# Patient Record
Sex: Male | Born: 1935 | Race: White | Hispanic: No | Marital: Married
Health system: Southern US, Community
[De-identification: ages and names within clinical notes are randomized; demographics above are authoritative.]

## PROBLEM LIST (undated history)

## (undated) DIAGNOSIS — I10 Essential (primary) hypertension: Secondary | ICD-10-CM

## (undated) DIAGNOSIS — E119 Type 2 diabetes mellitus without complications: Secondary | ICD-10-CM

## (undated) DIAGNOSIS — N289 Disorder of kidney and ureter, unspecified: Secondary | ICD-10-CM

---

## 2022-04-07 ENCOUNTER — Emergency Department (HOSPITAL_BASED_OUTPATIENT_CLINIC_OR_DEPARTMENT_OTHER): Payer: Medicare Other

## 2022-04-07 ENCOUNTER — Other Ambulatory Visit: Payer: Self-pay

## 2022-04-07 ENCOUNTER — Encounter (HOSPITAL_BASED_OUTPATIENT_CLINIC_OR_DEPARTMENT_OTHER): Payer: Self-pay | Admitting: Emergency Medicine

## 2022-04-07 DIAGNOSIS — Z20822 Contact with and (suspected) exposure to covid-19: Secondary | ICD-10-CM | POA: Insufficient documentation

## 2022-04-07 DIAGNOSIS — R509 Fever, unspecified: Secondary | ICD-10-CM | POA: Insufficient documentation

## 2022-04-07 DIAGNOSIS — R059 Cough, unspecified: Secondary | ICD-10-CM | POA: Insufficient documentation

## 2022-04-07 DIAGNOSIS — R0981 Nasal congestion: Secondary | ICD-10-CM | POA: Insufficient documentation

## 2022-04-07 LAB — CBC WITH DIFFERENTIAL/PLATELET
Abs Immature Granulocytes: 0.03 10*3/uL (ref 0.00–0.07)
Basophils Absolute: 0.1 10*3/uL (ref 0.0–0.1)
Basophils Relative: 1 %
Eosinophils Absolute: 0.5 10*3/uL (ref 0.0–0.5)
Eosinophils Relative: 5 %
HCT: 37.2 % — ABNORMAL LOW (ref 39.0–52.0)
Hemoglobin: 12.4 g/dL — ABNORMAL LOW (ref 13.0–17.0)
Immature Granulocytes: 0 %
Lymphocytes Relative: 19 %
Lymphs Abs: 1.9 10*3/uL (ref 0.7–4.0)
MCH: 30.3 pg (ref 26.0–34.0)
MCHC: 33.3 g/dL (ref 30.0–36.0)
MCV: 91 fL (ref 80.0–100.0)
Monocytes Absolute: 1.1 10*3/uL — ABNORMAL HIGH (ref 0.1–1.0)
Monocytes Relative: 11 %
Neutro Abs: 6.2 10*3/uL (ref 1.7–7.7)
Neutrophils Relative %: 64 %
Platelets: 184 10*3/uL (ref 150–400)
RBC: 4.09 MIL/uL — ABNORMAL LOW (ref 4.22–5.81)
RDW: 13.7 % (ref 11.5–15.5)
WBC: 9.8 10*3/uL (ref 4.0–10.5)
nRBC: 0 % (ref 0.0–0.2)

## 2022-04-07 LAB — COMPREHENSIVE METABOLIC PANEL
ALT: 25 U/L (ref 0–44)
AST: 20 U/L (ref 15–41)
Albumin: 3.8 g/dL (ref 3.5–5.0)
Alkaline Phosphatase: 83 U/L (ref 38–126)
Anion gap: 8 (ref 5–15)
BUN: 33 mg/dL — ABNORMAL HIGH (ref 8–23)
CO2: 22 mmol/L (ref 22–32)
Calcium: 8.9 mg/dL (ref 8.9–10.3)
Chloride: 108 mmol/L (ref 98–111)
Creatinine, Ser: 1.8 mg/dL — ABNORMAL HIGH (ref 0.61–1.24)
GFR, Estimated: 36 mL/min — ABNORMAL LOW (ref >60)
Glucose, Bld: 203 mg/dL — ABNORMAL HIGH (ref 70–99)
Potassium: 4.1 mmol/L (ref 3.5–5.1)
Sodium: 138 mmol/L (ref 135–145)
Total Bilirubin: 0.8 mg/dL (ref 0.3–1.2)
Total Protein: 7.7 g/dL (ref 6.5–8.1)

## 2022-04-07 LAB — LACTIC ACID, PLASMA: Lactic Acid, Venous: 1 mmol/L (ref 0.5–1.9)

## 2022-04-07 NOTE — ED Notes (Signed)
Unable to provide urine sample at this time

## 2022-04-07 NOTE — ED Triage Notes (Signed)
Fever and cough with shaking since yesterday. States he has some numbness in his L fingertips since yesterday. He just travelled by car here from [place].

## 2022-04-08 ENCOUNTER — Emergency Department (HOSPITAL_BASED_OUTPATIENT_CLINIC_OR_DEPARTMENT_OTHER)
Admission: EM | Admit: 2022-04-08 | Discharge: 2022-04-08 | Disposition: A | Discharge status: Home / Self Care | Payer: Medicare Other | Attending: Emergency Medicine | Admitting: Emergency Medicine

## 2022-04-08 ENCOUNTER — Emergency Department (HOSPITAL_BASED_OUTPATIENT_CLINIC_OR_DEPARTMENT_OTHER): Payer: Medicare Other

## 2022-04-08 ENCOUNTER — Encounter (HOSPITAL_BASED_OUTPATIENT_CLINIC_OR_DEPARTMENT_OTHER): Payer: Self-pay

## 2022-04-08 DIAGNOSIS — B9789 Other viral agents as the cause of diseases classified elsewhere: Secondary | ICD-10-CM

## 2022-04-08 DIAGNOSIS — R509 Fever, unspecified: Secondary | ICD-10-CM | POA: Diagnosis not present

## 2022-04-08 DIAGNOSIS — N289 Disorder of kidney and ureter, unspecified: Secondary | ICD-10-CM

## 2022-04-08 HISTORY — DX: Essential (primary) hypertension: I10

## 2022-04-08 HISTORY — DX: Type 2 diabetes mellitus without complications: E11.9

## 2022-04-08 HISTORY — DX: Disorder of kidney and ureter, unspecified: N28.9

## 2022-04-08 LAB — RESP PANEL BY RT-PCR (FLU A&B, COVID) ARPGX2
Influenza A by PCR: NEGATIVE
Influenza B by PCR: NEGATIVE
SARS Coronavirus 2 by RT PCR: NEGATIVE

## 2022-04-08 MED ORDER — IOHEXOL 350 MG/ML SOLN
75.0000 mL | Freq: Once | INTRAVENOUS | Status: AC | PRN
  Administered 2022-04-08: 75 mL via INTRAVENOUS

## 2022-04-08 NOTE — ED Provider Notes (Addendum)
[facility] EMERGENCY DEPARTMENT Provider Note   CSN: [id number] Arrival date & time: 04/07/22  2148     History  Chief Complaint  Patient presents with   Fever    Phillip Potts is a 88 y.o. male.  The history is provided by the patient.  URI Presenting symptoms: congestion and cough   Severity:  Moderate Onset quality:  Gradual Duration:  4 days Timing:  Intermittent Progression:  Unchanged Chronicity:  New Relieved by:  Nothing Worsened by:  Nothing Ineffective treatments:  None tried Associated symptoms: no arthralgias and no headaches   Risk factors: being elderly   Was visiting daughter who had a cold and now he has it.  Denies fever. Has not taken anything for symptoms.      Home Medications Prior to Admission medications   Not on File      Allergies    Patient has no known allergies.    Review of Systems   Review of Systems  HENT:  Positive for congestion. Negative for trouble swallowing and voice change.   Respiratory:  Positive for cough. Negative for shortness of breath and stridor.   Gastrointestinal:  Negative for vomiting.  Musculoskeletal:  Negative for arthralgias.  Neurological:  Negative for headaches.  All other systems reviewed and are negative.  Physical Exam Updated Vital Signs BP (!) 159/105 (BP Location: Left Arm)   Pulse 76   Temp 98.3 F (36.8 C) (Oral)   Resp 20   Ht 5\' 7"  (1.702 m)   Wt 69.9 kg   SpO2 99%   BMI 24.12 kg/m  Physical Exam Vitals and nursing note reviewed.  Constitutional:      General: He is not in acute distress.    Appearance: Normal appearance.  HENT:     Head: Normocephalic and atraumatic.     Nose: Congestion present.     Mouth/Throat:     Mouth: Mucous membranes are moist.  Eyes:     Pupils: Pupils are equal, round, and reactive to light.  Cardiovascular:     Rate and Rhythm: Normal rate and regular rhythm.     Pulses: Normal pulses.     Heart sounds: Normal heart sounds.  Pulmonary:      Effort: Pulmonary effort is normal.     Breath sounds: Normal breath sounds.  Abdominal:     General: Bowel sounds are normal.     Palpations: Abdomen is soft.     Tenderness: There is no abdominal tenderness. There is no guarding.  Musculoskeletal:        General: Normal range of motion.     Cervical back: Normal range of motion and neck supple.  Skin:    General: Skin is warm and dry.     Capillary Refill: Capillary refill takes less than 2 seconds.  Neurological:     General: No focal deficit present.     Mental Status: He is alert and oriented to person, place, and time.     Sensory: No sensory deficit.     Motor: No weakness.     Deep Tendon Reflexes: Reflexes normal.  Psychiatric:        Mood and Affect: Mood normal.        Behavior: Behavior normal.    ED Results / Procedures / Treatments   Labs (all labs ordered are listed, but only abnormal results are displayed) Results for orders placed or performed during the hospital encounter of 04/08/22  Lactic acid, plasma  Result Value  Ref Range   Lactic Acid, Venous 1.0 0.5 - 1.9 mmol/L  Comprehensive metabolic panel  Result Value Ref Range   Sodium 138 135 - 145 mmol/L   Potassium 4.1 3.5 - 5.1 mmol/L   Chloride 108 98 - 111 mmol/L   CO2 22 22 - 32 mmol/L   Glucose, Bld 203 (H) 70 - 99 mg/dL   BUN 33 (H) 8 - 23 mg/dL   Creatinine, Ser 1.611.80 (H) 0.61 - 1.24 mg/dL   Calcium 8.9 8.9 - 09.610.3 mg/dL   Total Protein 7.7 6.5 - 8.1 g/dL   Albumin 3.8 3.5 - 5.0 g/dL   AST 20 15 - 41 U/L   ALT 25 0 - 44 U/L   Alkaline Phosphatase 83 38 - 126 U/L   Total Bilirubin 0.8 0.3 - 1.2 mg/dL   GFR, Estimated 36 (L) >60 mL/min   Anion gap 8 5 - 15  CBC with Differential  Result Value Ref Range   WBC 9.8 4.0 - 10.5 K/uL   RBC 4.09 (L) 4.22 - 5.81 MIL/uL   Hemoglobin 12.4 (L) 13.0 - 17.0 g/dL   HCT 04.537.2 (L) 40.939.0 - 81.152.0 %   MCV 91.0 80.0 - 100.0 fL   MCH 30.3 26.0 - 34.0 pg   MCHC 33.3 30.0 - 36.0 g/dL   RDW 91.413.7 78.211.5 - 95.615.5 %    Platelets 184 150 - 400 K/uL   nRBC 0.0 0.0 - 0.2 %   Neutrophils Relative % 64 %   Neutro Abs 6.2 1.7 - 7.7 K/uL   Lymphocytes Relative 19 %   Lymphs Abs 1.9 0.7 - 4.0 K/uL   Monocytes Relative 11 %   Monocytes Absolute 1.1 (H) 0.1 - 1.0 K/uL   Eosinophils Relative 5 %   Eosinophils Absolute 0.5 0.0 - 0.5 K/uL   Basophils Relative 1 %   Basophils Absolute 0.1 0.0 - 0.1 K/uL   Immature Granulocytes 0 %   Abs Immature Granulocytes 0.03 0.00 - 0.07 K/uL   DG Chest 2 View  Result Date: 04/07/2022 CLINICAL DATA:  Fever and cough. EXAM: CHEST - 2 VIEW COMPARISON:  None Available. FINDINGS: Minimal bibasilar densities, likely atelectasis. Developing infiltrate is less likely. No lobar consolidation. There is no pleural effusion or pneumothorax. The cardiac silhouette is within limits. Atherosclerotic calcification of the aorta. No acute osseous pathology. Degenerative changes of the spine. IMPRESSION: Minimal bibasilar densities, likely atelectasis. Developing infiltrate is less likely. Electronically Signed   By: Elgie CollardArash  Radparvar M.D.   On: 04/07/2022 22:22      EKG None  Radiology DG Chest 2 View  Result Date: 04/07/2022 CLINICAL DATA:  Fever and cough. EXAM: CHEST - 2 VIEW COMPARISON:  None Available. FINDINGS: Minimal bibasilar densities, likely atelectasis. Developing infiltrate is less likely. No lobar consolidation. There is no pleural effusion or pneumothorax. The cardiac silhouette is within limits. Atherosclerotic calcification of the aorta. No acute osseous pathology. Degenerative changes of the spine. IMPRESSION: Minimal bibasilar densities, likely atelectasis. Developing infiltrate is less likely. Electronically Signed   By: Elgie CollardArash  Radparvar M.D.   On: 04/07/2022 22:22    Procedures Procedures    Medications Ordered in ED Medications  iohexol (OMNIPAQUE) 350 MG/ML injection 75 mL (75 mLs Intravenous Contrast Given 04/08/22 0216)    ED Course/ Medical Decision Making/  A&P                           Medical Decision Making Patient  with URI symptoms since being exposed to daughter  Unknown if she had covid or flu   Amount and/or Complexity of Data Reviewed External Data Reviewed: notes.    Details: previous notes reviewed. Labs: ordered.    Details: all labs reviewed.  lactate normal 1, normal sodium and potassium.  Creatinine elevated at 1.8.  Normal white count 9.8, hemoglobin 12.4 and normal platelets 184,000 Radiology: ordered and independent interpretation performed.    Details: normal CXR by me. No PE or PNA on CTA by me  Risk Prescription drug management. Risk Details: Will refer to renal for elevated creatinine.  URI.  No PNA no PE.  Tylenol and mucinex for symptoms.      Final Clinical Impression(s) / ED Diagnoses Final diagnoses:  None   Return for intractable cough, coughing up blood, fevers > 100.4 unrelieved by medication, shortness of breath, intractable vomiting, chest pain, shortness of breath, weakness, numbness, changes in speech, facial asymmetry, abdominal pain, passing out, Inability to tolerate liquids or food, cough, altered mental status or any concerns. No signs of systemic illness or infection. The patient is nontoxic-appearing on exam and vital signs are within normal limits.  I have reviewed the triage vital signs and the nursing notes. Pertinent labs & imaging results that were available during my care of the patient were reviewed by me and considered in my medical decision making (see chart for details). After history, exam, and medical workup I feel the patient has been appropriately medically screened and is safe for discharge home. Pertinent diagnoses were discussed with the patient. Patient was given return precautions.  Rx / DC Orders ED Discharge Orders     None           Juno Alers, MD 04/08/22 2563

## 2022-04-08 NOTE — ED Notes (Signed)
Patient transported to CT 

## 2023-05-01 IMAGING — CT CT ANGIO CHEST
2 of 8 series · 18 of 36 positions shown · IV contrast (agent unspecified)
Comparison: None Available.

CLINICAL DATA: Chest pain or SOB, pleurisy or effusion suspected.
Fever, cough

EXAM:
CT ANGIOGRAPHY CHEST WITH CONTRAST
TECHNIQUE: Multidetector CT imaging of the chest was performed using the
standard protocol during bolus administration of intravenous
contrast. Multiplanar CT image reconstructions and MIPs were
obtained to evaluate the vascular anatomy.

[Series 6: pe thins · axial · 0.74mm/px · z∈[-277,-46]mm · 17 of 259 slices shown]
[im 14/259  lung]
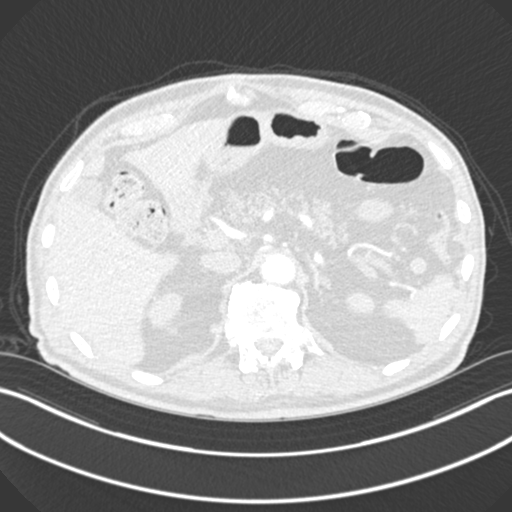
[im 28/259  mediastinal]
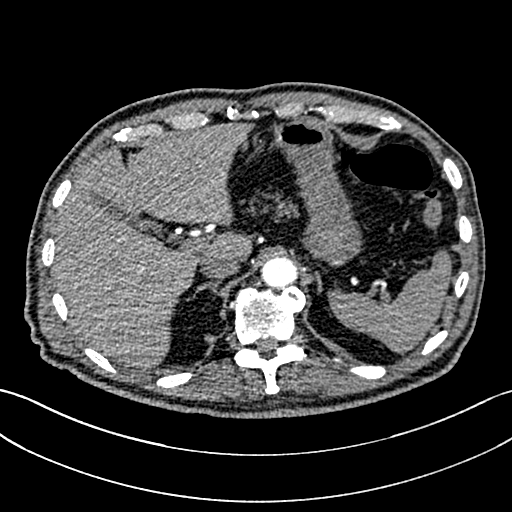
[im 41/259  lung]
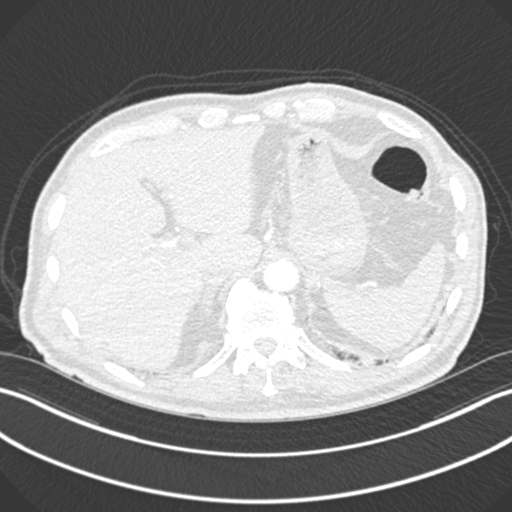
[im 55/259  mediastinal]
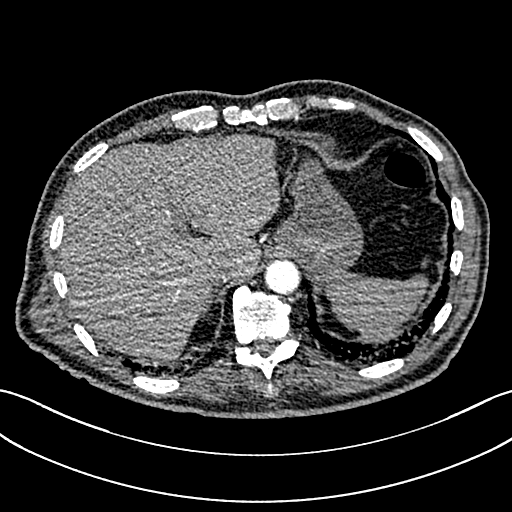
[im 68/259  lung]
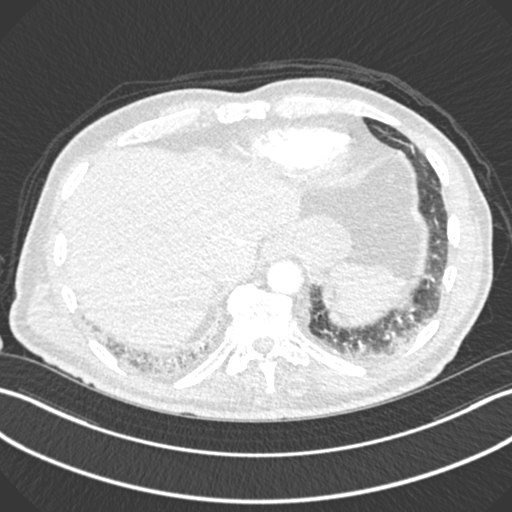
[im 82/259  mediastinal]
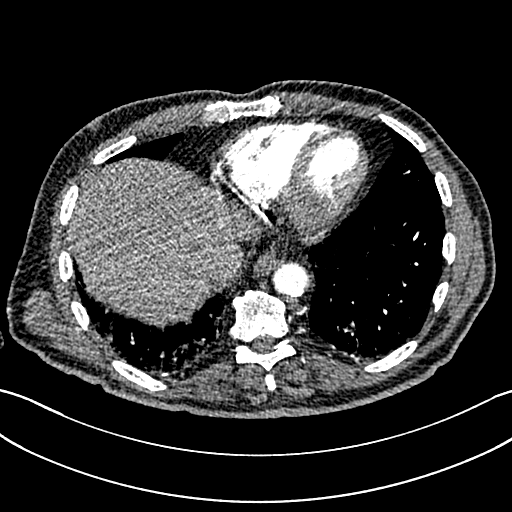
[im 96/259  lung]
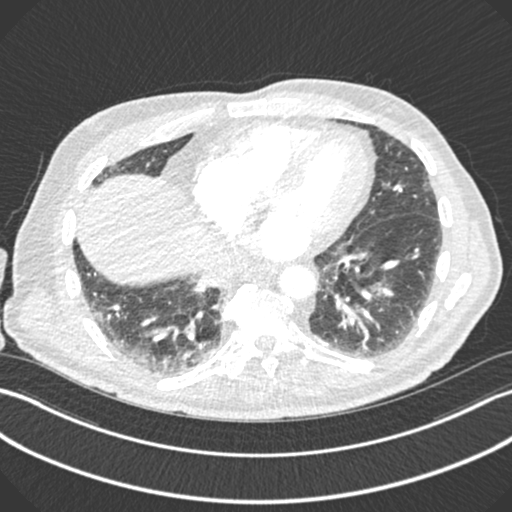
[im 109/259  mediastinal]
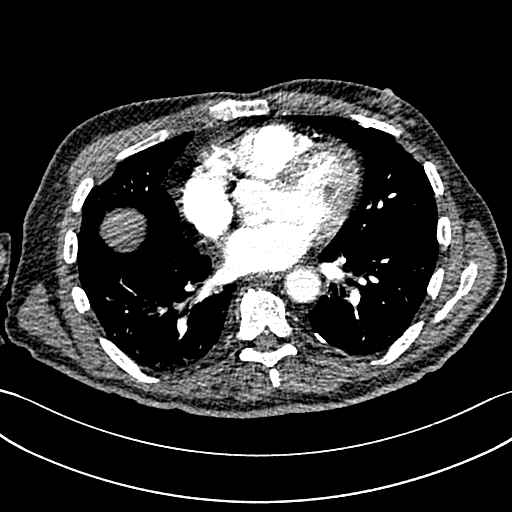
[im 136/259  lung]
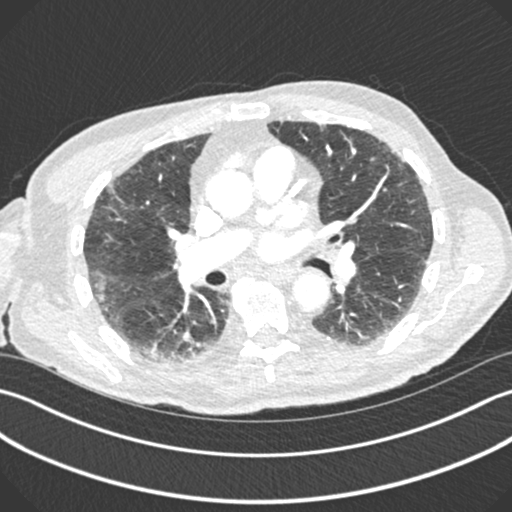
[im 150/259  mediastinal]
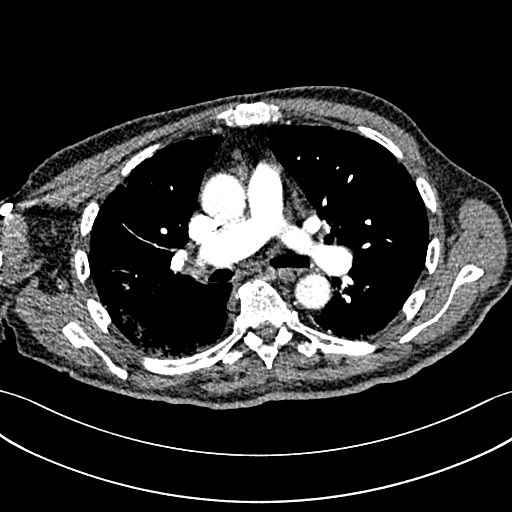
[im 163/259  lung]
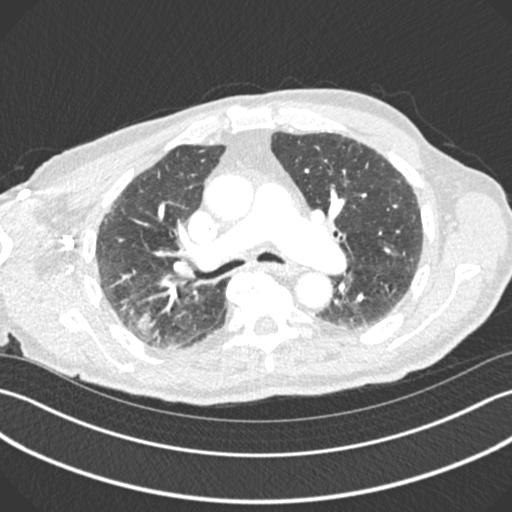
[im 177/259  mediastinal]
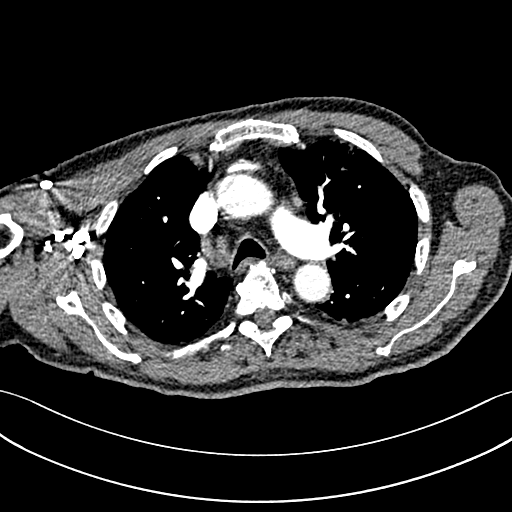
[im 191/259  lung]
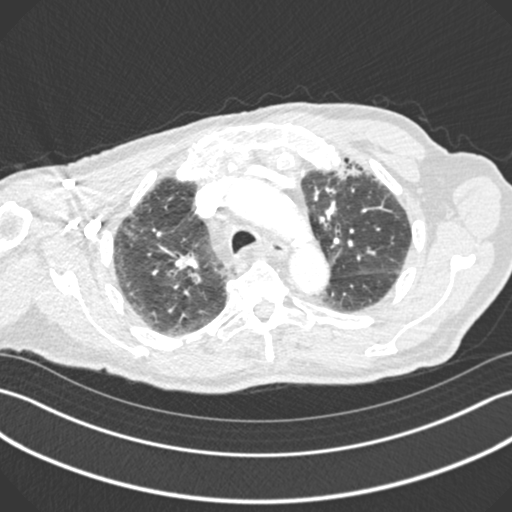
[im 204/259  mediastinal]
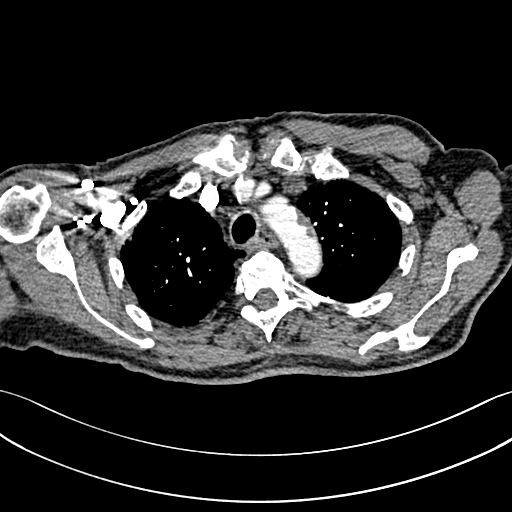
[im 218/259  lung]
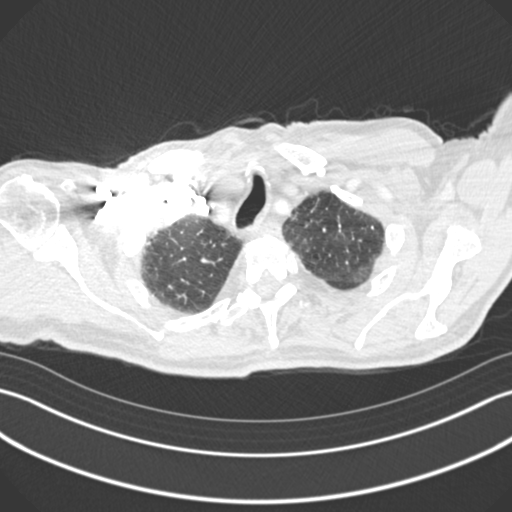
[im 231/259  mediastinal]
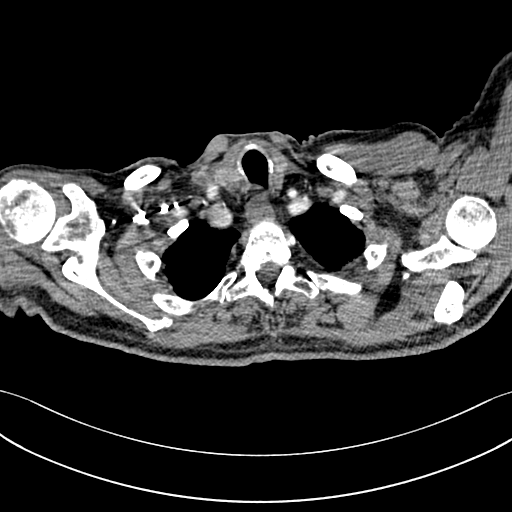
[im 245/259  lung]
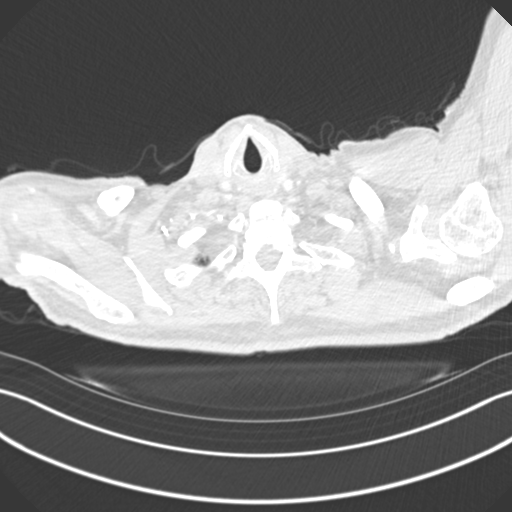

[Series 7: pe coronal mpr · coronal · 0.55mm/px · 1 of 135 slices shown]
[im 68/135  mediastinal]
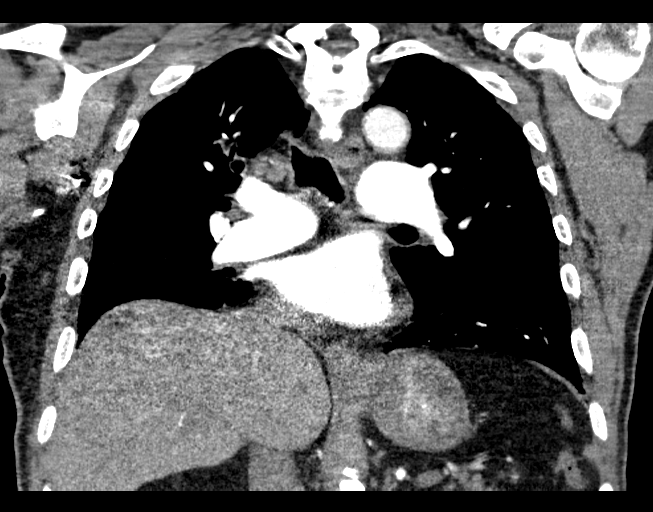

[18 of 36 positions shown; findings below may reference images not displayed]

RADIATION DOSE REDUCTION: This exam was performed according to the
departmental dose-optimization program which includes automated
exposure control, adjustment of the mA and/or kV according to
patient size and/or use of iterative reconstruction technique.

CONTRAST:  75mL OMNIPAQUE IOHEXOL 350 MG/ML SOLN
FINDINGS: Cardiovascular: Adequate opacification of the pulmonary arterial
tree. No intraluminal filling defect identified to suggest acute
pulmonary embolism. Central pulmonary arteries are of normal
caliber. Extensive multi-vessel coronary artery calcification.
Cardiac size within normal limits. No pericardial effusion. Mild
mixed atherosclerotic plaque within the thoracic aorta. No aortic
aneurysm.

Mediastinum/Nodes: No enlarged mediastinal, hilar, or axillary lymph
nodes. Thyroid gland, trachea, and esophagus demonstrate no
significant findings.

Lungs/Pleura: Scattered areas of linear atelectasis are noted
bilaterally. Mild subpleural fibrosis is seen demonstrating a
basilar predominance. Asymmetric fibrotic changes noted within the
left apex. Fine bilateral calcified pleural plaques are identified
which may reflect the sequela of prior asbestos exposure. There is
superimposed peribronchial nodular infiltrate within the lateral
segment of the right upper lobe, possibly infectious in the
appropriate clinical setting. No pneumothorax or pleural effusion.
Central airways are widely patent. Mild bronchial wall thickening is
present centrally in keeping with mild airway inflammation.

Upper Abdomen: No acute abnormality.

Musculoskeletal: No acute bone abnormality.

Review of the MIP images confirms the above findings.
IMPRESSION: No pulmonary embolism.

Extensive multi-vessel coronary artery calcification.

Focal nodular infiltrate within the right upper lobe, possibly
infectious in the acute setting. Superimposed airway inflammation.

Bilateral pleural plaques, possibly the sequela of prior asbestos
exposure. Superimposed mild subpleural pulmonary fibrosis.

Aortic Atherosclerosis ([id number].F).
